# Patient Record
Sex: Male | Born: 1960 | Race: White | Hispanic: No | Marital: Single | Smoking: Current every day smoker
Health system: Southern US, Community
[De-identification: ages and names within clinical notes are randomized; demographics above are authoritative.]

## PROBLEM LIST (undated history)

## (undated) DIAGNOSIS — K219 Gastro-esophageal reflux disease without esophagitis: Secondary | ICD-10-CM

## (undated) DIAGNOSIS — Z87442 Personal history of urinary calculi: Secondary | ICD-10-CM

## (undated) HISTORY — PX: APPENDECTOMY: SHX54

## (undated) HISTORY — PX: HAND SURGERY: SHX662

---

## 2008-03-14 ENCOUNTER — Emergency Department (HOSPITAL_COMMUNITY): Admission: EM | Admit: 2008-03-14 | Discharge: 2008-03-14 | Payer: Self-pay | Admitting: Emergency Medicine

## 2009-08-30 ENCOUNTER — Emergency Department (HOSPITAL_COMMUNITY): Admission: EM | Admit: 2009-08-30 | Discharge: 2009-08-30 | Payer: Self-pay | Admitting: Emergency Medicine

## 2013-07-09 ENCOUNTER — Emergency Department (HOSPITAL_COMMUNITY): Payer: BC Managed Care – PPO

## 2013-07-09 ENCOUNTER — Encounter (HOSPITAL_COMMUNITY): Payer: Self-pay | Admitting: Emergency Medicine

## 2013-07-09 ENCOUNTER — Emergency Department (HOSPITAL_COMMUNITY)
Admission: EM | Admit: 2013-07-09 | Discharge: 2013-07-10 | Disposition: A | Discharge status: Home / Self Care | Payer: BC Managed Care – PPO | Attending: Emergency Medicine | Admitting: Emergency Medicine

## 2013-07-09 DIAGNOSIS — N200 Calculus of kidney: Secondary | ICD-10-CM | POA: Insufficient documentation

## 2013-07-09 DIAGNOSIS — F17200 Nicotine dependence, unspecified, uncomplicated: Secondary | ICD-10-CM | POA: Insufficient documentation

## 2013-07-09 LAB — BASIC METABOLIC PANEL
Calcium: 9.3 mg/dL (ref 8.4–10.5)
GFR calc non Af Amer: 66 mL/min — ABNORMAL LOW (ref >90)
Glucose, Bld: 157 mg/dL — ABNORMAL HIGH (ref 70–99)
Sodium: 137 mEq/L (ref 135–145)

## 2013-07-09 LAB — CBC WITH DIFFERENTIAL/PLATELET
Basophils Relative: 1 % (ref 0–1)
Eosinophils Absolute: 0.1 10*3/uL (ref 0.0–0.7)
Eosinophils Relative: 2 % (ref 0–5)
Lymphs Abs: 3.3 10*3/uL (ref 0.7–4.0)
Monocytes Absolute: 0.6 10*3/uL (ref 0.1–1.0)
Monocytes Relative: 8 % (ref 3–12)
RBC: 5.41 MIL/uL (ref 4.22–5.81)
WBC: 8.1 10*3/uL (ref 4.0–10.5)

## 2013-07-09 LAB — URINE MICROSCOPIC-ADD ON

## 2013-07-09 LAB — URINALYSIS, ROUTINE W REFLEX MICROSCOPIC
Glucose, UA: NEGATIVE mg/dL
Ketones, ur: 15 mg/dL — AB
Nitrite: NEGATIVE
Protein, ur: 30 mg/dL — AB
Urobilinogen, UA: 0.2 mg/dL (ref 0.0–1.0)

## 2013-07-09 MED ORDER — ONDANSETRON HCL 4 MG/2ML IJ SOLN
4.0000 mg | Freq: Once | INTRAMUSCULAR | Status: AC
  Administered 2013-07-09: 4 mg via INTRAVENOUS
  Filled 2013-07-09: qty 2

## 2013-07-09 MED ORDER — KETOROLAC TROMETHAMINE 30 MG/ML IJ SOLN
30.0000 mg | Freq: Once | INTRAMUSCULAR | Status: AC
  Administered 2013-07-09: 30 mg via INTRAVENOUS
  Filled 2013-07-09: qty 1

## 2013-07-09 MED ORDER — SODIUM CHLORIDE 0.9 % IV BOLUS (SEPSIS)
1000.0000 mL | Freq: Once | INTRAVENOUS | Status: AC
  Administered 2013-07-09: 1000 mL via INTRAVENOUS

## 2013-07-09 MED ORDER — HYDROMORPHONE HCL PF 1 MG/ML IJ SOLN
1.0000 mg | Freq: Once | INTRAMUSCULAR | Status: AC
  Administered 2013-07-09: 1 mg via INTRAVENOUS
  Filled 2013-07-09: qty 1

## 2013-07-09 MED ORDER — OXYCODONE-ACETAMINOPHEN 5-325 MG PO TABS
1.0000 | ORAL_TABLET | Freq: Once | ORAL | Status: AC
  Administered 2013-07-09: 1 via ORAL
  Filled 2013-07-09: qty 1

## 2013-07-09 NOTE — ED Notes (Signed)
Pt states he is having testicle pain that is radiating into his abd.  Pt states this pain is similar to when he has had kidney stones in the past.

## 2013-07-09 NOTE — ED Provider Notes (Signed)
CSN: [id number]     Arrival date & time 07/09/13  2206 History   None    Chief Complaint  Patient presents with  . Testicle Pain   (Consider location/radiation/quality/duration/timing/severity/associated sxs/prior Treatment) HPI Comments: 52 yo wm with L sided test pain and LLQ abd pain. Onset 2 hrs ago. Pt also has very dark urine.  He has a h/o kidney stones 15 years ago and his symptoms are identic today.    Patient is a 52 y.o. male presenting with testicular pain. The history is provided by the patient and a significant other.  Testicle Pain This is a new problem. The current episode started 1 to 2 hours ago. The problem occurs constantly. The problem has not changed since onset.Associated symptoms include abdominal pain. Pertinent negatives include no chest pain, no headaches and no shortness of breath. Nothing aggravates the symptoms. Nothing relieves the symptoms. He has tried nothing (feels identicle to previous kidney stone) for the symptoms.    Past Medical History  Diagnosis Date  . Kidney stones    Past Surgical History  Procedure Laterality Date  . Appendectomy    . Hand surgery     No family history on file. History  Substance Use Topics  . Smoking status: Current Every Day Smoker -- 0.50 packs/day    Types: Cigarettes  . Smokeless tobacco: Not on file  . Alcohol Use: 4.8 oz/week    8 Cans of beer per week    Review of Systems  HENT: Negative.   Respiratory: Negative.  Negative for shortness of breath.   Cardiovascular: Negative for chest pain.  Gastrointestinal: Positive for nausea and abdominal pain.  Genitourinary: Positive for flank pain and testicular pain. Negative for dysuria, frequency, hematuria, discharge, penile swelling, scrotal swelling, enuresis, difficulty urinating, genital sores and penile pain.  Musculoskeletal: Negative.   Neurological: Negative for headaches.    Allergies  Review of patient's allergies indicates no known  allergies.  Home Medications   Current Outpatient Rx  Name  Route  Sig  Dispense  Refill  . omeprazole (PRILOSEC) 20 MG capsule   Oral   Take 20 mg by mouth 2 (two) times daily as needed (for acid reflux).         Marland Kitchen HYDROcodone-acetaminophen (NORCO/VICODIN) 5-325 MG per tablet   Oral   Take 2 tablets by mouth every 6 (six) hours as needed.   20 tablet   0   . naproxen (NAPROSYN) 500 MG tablet   Oral   Take 1 tablet (500 mg total) by mouth 2 (two) times daily.   30 tablet   0   . ondansetron (ZOFRAN) 4 MG tablet   Oral   Take 1 tablet (4 mg total) by mouth every 6 (six) hours.   12 tablet   0    BP 131/63  Pulse 96  Temp(Src) 98 F (36.7 C) (Oral)  Resp 16  Wt 181 lb 12.8 oz (82.464 kg)  SpO2 96% Physical Exam  Nursing note and vitals reviewed. Constitutional: He appears well-developed and well-nourished. He appears distressed.  HENT:  Head: Normocephalic and atraumatic.  Eyes: Conjunctivae are normal. Pupils are equal, round, and reactive to light.  Neck: Normal range of motion. Neck supple.  Cardiovascular: Normal rate and regular rhythm.   Pulmonary/Chest: Effort normal and breath sounds normal.  Abdominal: Soft. Bowel sounds are normal. He exhibits no distension and no mass. There is no tenderness. There is no rebound and no guarding. Hernia confirmed negative in the  right inguinal area and confirmed negative in the left inguinal area.  Genitourinary: Testes normal and penis normal. Cremasteric reflex is present. Right testis shows no mass, no swelling and no tenderness. Left testis shows no mass, no swelling and no tenderness. Circumcised. No phimosis, paraphimosis, hypospadias, penile erythema or penile tenderness. No discharge found.  Benign exam    ED Course  Procedures (including critical care time) Labs Review Results for orders placed during the hospital encounter of 07/09/13  URINALYSIS, ROUTINE W REFLEX MICROSCOPIC      Result Value Range   Color,  Urine AMBER (*) YELLOW   APPearance CLOUDY (*) CLEAR   Specific Gravity, Urine 1.024  1.005 - 1.030   pH 5.0  5.0 - 8.0   Glucose, UA NEGATIVE  NEGATIVE mg/dL   Hgb urine dipstick LARGE (*) NEGATIVE   Bilirubin Urine NEGATIVE  NEGATIVE   Ketones, ur 15 (*) NEGATIVE mg/dL   Protein, ur 30 (*) NEGATIVE mg/dL   Urobilinogen, UA 0.2  0.0 - 1.0 mg/dL   Nitrite NEGATIVE  NEGATIVE   Leukocytes, UA TRACE (*) NEGATIVE  CBC WITH DIFFERENTIAL      Result Value Range   WBC 8.1  4.0 - 10.5 K/uL   RBC 5.41  4.22 - 5.81 MIL/uL   Hemoglobin 15.3  13.0 - 17.0 g/dL   HCT 29.5  62.1 - 30.8 %   MCV 82.4  78.0 - 100.0 fL   MCH 28.3  26.0 - 34.0 pg   MCHC 34.3  30.0 - 36.0 g/dL   RDW 65.7  84.6 - 96.2 %   Platelets 274  150 - 400 K/uL   Neutrophils Relative % 50  43 - 77 %   Neutro Abs 4.1  1.7 - 7.7 K/uL   Lymphocytes Relative 40  12 - 46 %   Lymphs Abs 3.3  0.7 - 4.0 K/uL   Monocytes Relative 8  3 - 12 %   Monocytes Absolute 0.6  0.1 - 1.0 K/uL   Eosinophils Relative 2  0 - 5 %   Eosinophils Absolute 0.1  0.0 - 0.7 K/uL   Basophils Relative 1  0 - 1 %   Basophils Absolute 0.1  0.0 - 0.1 K/uL  BASIC METABOLIC PANEL      Result Value Range   Sodium 137  135 - 145 mEq/L   Potassium 3.6  3.5 - 5.1 mEq/L   Chloride 99  96 - 112 mEq/L   CO2 23  19 - 32 mEq/L   Glucose, Bld 157 (*) 70 - 99 mg/dL   BUN 15  6 - 23 mg/dL   Creatinine, Ser 9.52  0.50 - 1.35 mg/dL   Calcium 9.3  8.4 - 84.1 mg/dL   GFR calc non Af Amer 66 (*) >90 mL/min   GFR calc Af Amer 76 (*) >90 mL/min  URINE MICROSCOPIC-ADD ON      Result Value Range   Squamous Epithelial / LPF FEW (*) RARE   WBC, UA 0-2  <3 WBC/hpf   RBC / HPF TOO NUMEROUS TO COUNT  <3 RBC/hpf   Bacteria, UA FEW (*) RARE   Casts HYALINE CASTS (*) NEGATIVE   Urine-Other MUCOUS PRESENT     CT ABD/Pelvis:  IMPRESSION: Left hydronephrosis secondary to a 4 mm proximal left ureteral calculus.  Probable small right lobe hepatic cyst 9 mm  diameter.   Electronically Signed By: Ulyses Southward M.D. On: 07/10/2013 00:21  MDM   1. Kidney stone    52 year old male presents emergency department with chief complaint of left lower quadrant, left flank, left testicle pain. ER workup consistent with kidney stone. Doubt infected stone. Stone was 4 mm. Patient received significant improvement of symptoms with ER medications. Plan to discharge patient home with Norco, Naprosyn, Zofran. ER precautions were given for fever, worsening pain, by mouth intolerance, or other concerns. Patient given consult to urology and he should call tomorrow for an appointment. Patient agrees with plan.  Darlys Gales, MD 07/10/13 [phone]

## 2013-07-10 MED ORDER — HYDROCODONE-ACETAMINOPHEN 5-325 MG PO TABS: 2.0000 | ORAL_TABLET | Freq: Four times a day (QID) | ORAL | Status: AC | PRN

## 2013-07-10 MED ORDER — NAPROXEN 500 MG PO TABS: 500.0000 mg | ORAL_TABLET | Freq: Two times a day (BID) | ORAL | Status: DC

## 2013-07-10 MED ORDER — ONDANSETRON HCL 4 MG PO TABS: 4.0000 mg | ORAL_TABLET | Freq: Four times a day (QID) | ORAL | Status: DC

## 2013-07-18 ENCOUNTER — Encounter (HOSPITAL_COMMUNITY): Payer: Self-pay | Admitting: *Deleted

## 2013-07-18 ENCOUNTER — Other Ambulatory Visit: Payer: Self-pay | Admitting: Urology

## 2013-07-18 NOTE — Progress Notes (Signed)
[facility]EDAY SURGERY INSTRUCTIONS AND CHLORHEXIDNE INSTRUCTIONS AND PRECAUTIONS REVIEWED WITH PATIENT.

## 2013-07-24 ENCOUNTER — Other Ambulatory Visit (HOSPITAL_COMMUNITY): Payer: Self-pay | Admitting: Urology

## 2013-07-24 DIAGNOSIS — K769 Liver disease, unspecified: Secondary | ICD-10-CM

## 2013-07-28 ENCOUNTER — Ambulatory Visit (HOSPITAL_COMMUNITY)
Admission: RE | Admit: 2013-07-28 | Discharge: 2013-07-28 | Disposition: A | Discharge status: Home / Self Care | Payer: BC Managed Care – PPO | Source: Ambulatory Visit | Attending: Urology | Admitting: Urology

## 2013-07-28 DIAGNOSIS — K769 Liver disease, unspecified: Secondary | ICD-10-CM

## 2013-07-28 DIAGNOSIS — N1330 Unspecified hydronephrosis: Secondary | ICD-10-CM | POA: Insufficient documentation

## 2013-07-28 DIAGNOSIS — R109 Unspecified abdominal pain: Secondary | ICD-10-CM | POA: Insufficient documentation

## 2013-07-28 DIAGNOSIS — K7689 Other specified diseases of liver: Secondary | ICD-10-CM | POA: Insufficient documentation

## 2013-07-30 ENCOUNTER — Encounter (HOSPITAL_COMMUNITY): Payer: Self-pay | Admitting: Emergency Medicine

## 2013-07-30 ENCOUNTER — Emergency Department (HOSPITAL_COMMUNITY)
Admission: EM | Admit: 2013-07-30 | Discharge: 2013-07-30 | Disposition: A | Discharge status: Home / Self Care | Payer: BC Managed Care – PPO | Attending: Emergency Medicine | Admitting: Emergency Medicine

## 2013-07-30 DIAGNOSIS — R109 Unspecified abdominal pain: Secondary | ICD-10-CM | POA: Insufficient documentation

## 2013-07-30 DIAGNOSIS — F17200 Nicotine dependence, unspecified, uncomplicated: Secondary | ICD-10-CM | POA: Insufficient documentation

## 2013-07-30 DIAGNOSIS — Z87442 Personal history of urinary calculi: Secondary | ICD-10-CM | POA: Insufficient documentation

## 2013-07-30 DIAGNOSIS — K219 Gastro-esophageal reflux disease without esophagitis: Secondary | ICD-10-CM | POA: Insufficient documentation

## 2013-07-30 LAB — URINALYSIS, ROUTINE W REFLEX MICROSCOPIC
Bilirubin Urine: NEGATIVE
Glucose, UA: NEGATIVE mg/dL
Ketones, ur: NEGATIVE mg/dL
Leukocytes, UA: NEGATIVE
Nitrite: NEGATIVE
Protein, ur: NEGATIVE mg/dL
Specific Gravity, Urine: 1.014 (ref 1.005–1.030)
Urobilinogen, UA: 0.2 mg/dL (ref 0.0–1.0)
pH: 6.5 (ref 5.0–8.0)

## 2013-07-30 LAB — URINE MICROSCOPIC-ADD ON

## 2013-07-30 MED ORDER — ONDANSETRON 4 MG PO TBDP
4.0000 mg | ORAL_TABLET | Freq: Once | ORAL | Status: AC
Start: 2013-07-30 — End: 2013-07-30
  Administered 2013-07-30: 4 mg via ORAL
  Filled 2013-07-30: qty 1

## 2013-07-30 MED ORDER — KETOROLAC TROMETHAMINE 30 MG/ML IJ SOLN
30.0000 mg | Freq: Once | INTRAMUSCULAR | Status: AC
  Administered 2013-07-30: 30 mg via INTRAMUSCULAR
  Filled 2013-07-30: qty 1

## 2013-07-30 MED ORDER — HYDROMORPHONE HCL PF 1 MG/ML IJ SOLN
1.0000 mg | Freq: Once | INTRAMUSCULAR | Status: AC
  Administered 2013-07-30: 1 mg via INTRAMUSCULAR
  Filled 2013-07-30: qty 1

## 2013-07-30 NOTE — ED Notes (Addendum)
Patient c/o lower abdominal pain that radiates into both groin areas. Patient denies any difficulties urinating or having blood in his urine. Patient states he was seen in the ED 2 days ago for the same and is scheduled for surgery this week. Patient also states he took a hydrocodone prior to coming to the ED for the pain.

## 2013-07-30 NOTE — H&P (Signed)
Chief Complaint Kidney stone   History of Present Illness Ian Todd is a 53 year old gentleman presents today after initially developing severe left-sided testicular pain approximately 1 week ago. He also was noted to have gross hematuria and some nausea. He presented to the emergency department and eventually was found to have hematuria and underwent a CT scan of the abdomen and pelvis without contrast which demonstrated a 4 mm proximal left ureteral calculus with hydronephrosis. His pain subsequently improved although he has since developed intermittent left-sided flank pain which has required narcotic pain medication for relief. He has denied any fever, persistent nausea/vomiting, or uncontrolled pain. He did have one episode of kidney stones approximately 15 years ago. He was able to spontaneously pass the stone without the need for surgical intervention.    Incidentally, he was also noted to have a 9 mm probable cyst of the right lobe of the liver on CT scan.   Past Medical History Problems  1. History of gastroesophageal reflux (GERD) (V12.79) 2. History of urinary stone (V13.01)  Surgical History Problems  1. History of Appendectomy 2. History of Hand Repair  Current Meds 1. Hydrocodone-Acetaminophen 5-325 MG Oral Tablet;  Therapy: (Recorded:22Dec2014) to Recorded 2. Naproxen 500 MG Oral Tablet;  Therapy: (Recorded:22Dec2014) to Recorded 3. Omeprazole 20 MG Oral Tablet Delayed Release;  Therapy: (Recorded:22Dec2014) to Recorded 4. Zofran 4 MG Oral Tablet;  Therapy: (Recorded:22Dec2014) to Recorded  Allergies Medication  1. No Known Drug Allergies  Family History Problems  1. No pertinent family history : Father  Social History Problems    Alcohol use   8 cans of beer per week   Current every day smoker (305.1)   Number of children   1 girl   Single  Review of Systems Constitutional, skin, eye, otolaryngeal, hematologic/lymphatic, cardiovascular, pulmonary,  endocrine, musculoskeletal, gastrointestinal, neurological and psychiatric system(s) were reviewed and pertinent findings if present are noted.  Gastrointestinal: heartburn.  Musculoskeletal: back pain.    Vitals Vital Signs [Data Includes: Last 1 Day]  Recorded: 23Dec2014 08:09AM  Height: 5 ft 9 in Weight: 175 lb  BMI Calculated: 25.84 BSA Calculated: 1.95 Blood Pressure: 107 / 74 Heart Rate: 69  Physical Exam Constitutional: Well nourished and well developed . No acute distress.  ENT:. The ears and nose are normal in appearance.  Neck: The appearance of the neck is normal and no neck mass is present.  Pulmonary: No respiratory distress, normal respiratory rhythm and effort and clear bilateral breath sounds.  Cardiovascular: Heart rate and rhythm are normal . No peripheral edema.  Abdomen: The abdomen is soft and nontender. No masses are palpated. No CVA tenderness. No hernias are palpable. No hepatosplenomegaly noted.  Genitourinary: Examination of the penis demonstrates no discharge, no masses, no lesions and a normal meatus. The scrotum is without lesions. The right epididymis is palpably normal and non-tender. The left epididymis is palpably normal and non-tender. The right testis is non-tender and without masses. The left testis is non-tender and without masses.  Lymphatics: The femoral and inguinal nodes are not enlarged or tender.  Skin: Normal skin turgor, no visible rash and no visible skin lesions.  Neuro/Psych:. Mood and affect are appropriate.    Results/Data Urine [Data Includes: Last 1 Day]   23Dec2014  COLOR YELLOW   APPEARANCE CLEAR   SPECIFIC GRAVITY 1.020   pH 5.5   GLUCOSE NEG mg/dL  BILIRUBIN NEG   KETONE NEG mg/dL  BLOOD SMALL   PROTEIN NEG mg/dL  UROBILINOGEN 0.2 mg/dL  NITRITE  NEG   LEUKOCYTE ESTERASE NEG   SQUAMOUS EPITHELIAL/HPF RARE   WBC 0-2 WBC/hpf  RBC 3-6 RBC/hpf  BACTERIA RARE   CRYSTALS NONE SEEN   CASTS NONE SEEN    I independently  reviewed his CT scan and medical records. His CT scan from 07/10/13 demonstrates a 4 mm left proximal ureteral calculus with left-sided hydronephrosis. This stone measures 663 Hounsfield units. No other calculi are identified. He also has a small hypodense lesion of the right lobe of the liver consistent with a possible cyst.     I independently reviewed a KUB x-ray today. There is a small 3 mm calcification in the vicinity of the distal left ureter and that possibly represents his ureteral calculus. I do not see any calcifications over the expected course of the proximal left ureter. He does have bilateral phleboliths within the pelvis.    Serum creatinine is 1.25.   Assessment Assessed  1. Ureteral stone with hydronephrosis (592.1,591) 2. 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 UA With REFLEX; [Do Not Release]; Status:Complete;   Done: 23Dec2014 08:01AM Neoplasm of uncertain behavior of liver  2. COMPREHENSIVE METABOLIC PANEL; Status:Hold For - Specimen/Data  Collection,Appointment; Requested for:23Dec2014;  3. OUTSIDE RAD MISC; Status:Hold For - Appointment,PreCert,Print,Records; Requested  for:23Dec2014;  Ureteral stone with hydronephrosis  4. Start: Oxycodone-Acetaminophen 5-325 MG Oral Tablet (Percocet); Take 1-2 tablets every  4-6 hrs 5. Follow-up Office  Follow-up - Will call to tentatively schedule surgery  Status: Hold For -  Appointment,Date of Service  Requested for: 23Dec2014 6. [facility]; Status:Resulted - Requires Verification;   Done: 23Dec2014 12:00AM  Discussion/Summary 1. Left ureteral calculus: We discussed options for management. Currently, he has no indication for urgent surgical intervention. He therefore will proceed with medical expulsion therapy and has been provided samples for Rapaflo today with instructions on proper use and potential side effects as well as counseling that this is off label use for this medication. He will  strain his urine and understands that he should call us should he develop fever, persistent nausea/vomiting, or uncontrolled pain. He has been provided a prescription for Percocet # 50 today.   Tentatively, we have discussed proceeding with definitive surgical intervention if he is unable to pass his stone. We reviewed treatment options including shockwave lithotripsy and ureteroscopic laser lithotripsy and the pros and cons of each approach. After our discussion, he does tentatively wish to proceed with left ureteroscopic lithotripsy and stone removal. We did review the potential risks, complications, and expected recovery process as well as the potential need for a postoperative ureteral stent. He gives informed consent today. He will notify me if he passes a stone before his planned procedure.    2. Hypodense hepatic lesion: While this is statistically a cyst, he has not undergone definitive evaluation and does have concern about this lesion. Liver function tests will be checked today and he will be scheduled for an abdominal ultrasound for further evaluation.         Signatures Electronically signed by : Heloise Purpura, M.D.; Jul 18 2013  9:16AM EST

## 2013-07-30 NOTE — ED Notes (Signed)
Patient's wife came to [facility] and rudely stated, "well, what are we waiting on now." Writer went to patient and patient stated he was ready for discharge. EDP notified.

## 2013-07-31 ENCOUNTER — Encounter (HOSPITAL_COMMUNITY): Payer: Self-pay | Admitting: *Deleted

## 2013-07-31 ENCOUNTER — Ambulatory Visit (HOSPITAL_COMMUNITY)
Admission: RE | Admit: 2013-07-31 | Discharge: 2013-07-31 | Disposition: A | Discharge status: Home / Self Care | Payer: BC Managed Care – PPO | Source: Ambulatory Visit | Attending: Urology | Admitting: Urology

## 2013-07-31 ENCOUNTER — Encounter (HOSPITAL_COMMUNITY): Payer: BC Managed Care – PPO | Admitting: Anesthesiology

## 2013-07-31 ENCOUNTER — Ambulatory Visit (HOSPITAL_COMMUNITY): Payer: BC Managed Care – PPO | Admitting: Anesthesiology

## 2013-07-31 ENCOUNTER — Encounter (HOSPITAL_COMMUNITY)
Admission: RE | Disposition: A | Discharge status: Home / Self Care | Payer: Self-pay | Source: Ambulatory Visit | Attending: Urology

## 2013-07-31 DIAGNOSIS — Z87442 Personal history of urinary calculi: Secondary | ICD-10-CM | POA: Insufficient documentation

## 2013-07-31 DIAGNOSIS — K219 Gastro-esophageal reflux disease without esophagitis: Secondary | ICD-10-CM | POA: Insufficient documentation

## 2013-07-31 DIAGNOSIS — I4510 Unspecified right bundle-branch block: Secondary | ICD-10-CM | POA: Insufficient documentation

## 2013-07-31 DIAGNOSIS — N1330 Unspecified hydronephrosis: Secondary | ICD-10-CM | POA: Insufficient documentation

## 2013-07-31 DIAGNOSIS — N509 Disorder of male genital organs, unspecified: Secondary | ICD-10-CM | POA: Insufficient documentation

## 2013-07-31 DIAGNOSIS — Z79899 Other long term (current) drug therapy: Secondary | ICD-10-CM | POA: Insufficient documentation

## 2013-07-31 DIAGNOSIS — R12 Heartburn: Secondary | ICD-10-CM | POA: Insufficient documentation

## 2013-07-31 DIAGNOSIS — K7689 Other specified diseases of liver: Secondary | ICD-10-CM | POA: Insufficient documentation

## 2013-07-31 DIAGNOSIS — F17200 Nicotine dependence, unspecified, uncomplicated: Secondary | ICD-10-CM | POA: Insufficient documentation

## 2013-07-31 DIAGNOSIS — N201 Calculus of ureter: Secondary | ICD-10-CM | POA: Insufficient documentation

## 2013-07-31 HISTORY — PX: HOLMIUM LASER APPLICATION: SHX5852

## 2013-07-31 HISTORY — PX: CYSTOSCOPY WITH RETROGRADE PYELOGRAM, URETEROSCOPY AND STENT PLACEMENT: SHX5789

## 2013-07-31 HISTORY — DX: Personal history of urinary calculi: Z87.442

## 2013-07-31 HISTORY — DX: Gastro-esophageal reflux disease without esophagitis: K21.9

## 2013-07-31 SURGERY — CYSTOURETEROSCOPY, WITH RETROGRADE PYELOGRAM AND STENT INSERTION
Anesthesia: General | Site: Ureter | Laterality: Left

## 2013-07-31 MED ORDER — CIPROFLOXACIN IN D5W 400 MG/200ML IV SOLN
400.0000 mg | INTRAVENOUS | Status: DC
Start: 2013-07-31 — End: 2013-07-31

## 2013-07-31 MED ORDER — PROMETHAZINE HCL 25 MG/ML IJ SOLN: 6.2500 mg | INTRAMUSCULAR | Status: DC | PRN

## 2013-07-31 MED ORDER — KETAMINE HCL 10 MG/ML IJ SOLN
INTRAMUSCULAR | Status: AC
  Filled 2013-07-31: qty 1

## 2013-07-31 MED ORDER — CIPROFLOXACIN IN D5W 400 MG/200ML IV SOLN
INTRAVENOUS | Status: AC
  Filled 2013-07-31: qty 200

## 2013-07-31 MED ORDER — MIDAZOLAM HCL 2 MG/2ML IJ SOLN
INTRAMUSCULAR | Status: AC
  Filled 2013-07-31: qty 2

## 2013-07-31 MED ORDER — SODIUM CHLORIDE 0.9 % IR SOLN
Status: DC | PRN
  Administered 2013-07-31: 6000 mL

## 2013-07-31 MED ORDER — MIDAZOLAM HCL 5 MG/5ML IJ SOLN
INTRAMUSCULAR | Status: DC | PRN
  Administered 2013-07-31: 1 mg via INTRAVENOUS

## 2013-07-31 MED ORDER — CISATRACURIUM BESYLATE (PF) 10 MG/5ML IV SOLN
INTRAVENOUS | Status: DC | PRN
  Administered 2013-07-31: 400 mg via INTRAVENOUS

## 2013-07-31 MED ORDER — HYDROCODONE-ACETAMINOPHEN 5-325 MG PO TABS: 1.0000 | ORAL_TABLET | Freq: Four times a day (QID) | ORAL | Status: AC | PRN

## 2013-07-31 MED ORDER — FENTANYL CITRATE 0.05 MG/ML IJ SOLN
INTRAMUSCULAR | Status: AC
  Filled 2013-07-31: qty 2

## 2013-07-31 MED ORDER — CIPROFLOXACIN HCL 500 MG PO TABS: ORAL_TABLET | ORAL | Status: AC

## 2013-07-31 MED ORDER — ONDANSETRON HCL 4 MG/2ML IJ SOLN
INTRAMUSCULAR | Status: AC
  Filled 2013-07-31: qty 2

## 2013-07-31 MED ORDER — HYDROMORPHONE HCL PF 1 MG/ML IJ SOLN
0.2500 mg | INTRAMUSCULAR | Status: DC | PRN
  Administered 2013-07-31 (×2): 0.5 mg via INTRAVENOUS

## 2013-07-31 MED ORDER — DEXAMETHASONE SODIUM PHOSPHATE 10 MG/ML IJ SOLN
INTRAMUSCULAR | Status: AC
  Filled 2013-07-31: qty 1

## 2013-07-31 MED ORDER — KETAMINE HCL 10 MG/ML IJ SOLN
INTRAMUSCULAR | Status: DC | PRN
  Administered 2013-07-31: 25 mg via INTRAVENOUS

## 2013-07-31 MED ORDER — FENTANYL CITRATE 0.05 MG/ML IJ SOLN
INTRAMUSCULAR | Status: DC | PRN
  Administered 2013-07-31 (×2): 50 ug via INTRAVENOUS

## 2013-07-31 MED ORDER — PROPOFOL 10 MG/ML IV BOLUS
INTRAVENOUS | Status: AC
  Filled 2013-07-31: qty 20

## 2013-07-31 MED ORDER — OXYCODONE HCL 5 MG PO TABS
5.0000 mg | ORAL_TABLET | Freq: Once | ORAL | Status: AC | PRN
  Administered 2013-07-31: 5 mg via ORAL
  Filled 2013-07-31: qty 1

## 2013-07-31 MED ORDER — HYDROMORPHONE HCL PF 1 MG/ML IJ SOLN
INTRAMUSCULAR | Status: AC
  Filled 2013-07-31: qty 1

## 2013-07-31 MED ORDER — OXYCODONE HCL 5 MG/5ML PO SOLN
5.0000 mg | Freq: Once | ORAL | Status: AC | PRN
  Filled 2013-07-31: qty 5

## 2013-07-31 MED ORDER — LACTATED RINGERS IV SOLN
INTRAVENOUS | Status: DC | PRN
  Administered 2013-07-31: 10:00:00 via INTRAVENOUS

## 2013-07-31 MED ORDER — ONDANSETRON HCL 4 MG/2ML IJ SOLN
INTRAMUSCULAR | Status: DC | PRN
  Administered 2013-07-31 (×2): 2 mg via INTRAVENOUS

## 2013-07-31 MED ORDER — PROPOFOL 10 MG/ML IV BOLUS
INTRAVENOUS | Status: DC | PRN
  Administered 2013-07-31: 200 mg via INTRAVENOUS

## 2013-07-31 MED ORDER — GLYCOPYRROLATE 0.2 MG/ML IJ SOLN
INTRAMUSCULAR | Status: AC
  Filled 2013-07-31: qty 1

## 2013-07-31 MED ORDER — MEPERIDINE HCL 50 MG/ML IJ SOLN: 6.2500 mg | INTRAMUSCULAR | Status: DC | PRN

## 2013-07-31 MED ORDER — IOHEXOL 300 MG/ML  SOLN
INTRAMUSCULAR | Status: DC | PRN
  Administered 2013-07-31: 6 mL via URETHRAL

## 2013-07-31 MED ORDER — LIDOCAINE HCL (CARDIAC) 20 MG/ML IV SOLN
INTRAVENOUS | Status: DC | PRN
  Administered 2013-07-31: 30 mg via INTRAVENOUS

## 2013-07-31 SURGICAL SUPPLY — 17 items
BAG URO CATCHER STRL LF (DRAPE) ×3 IMPLANT
BASKET ZERO TIP NITINOL 2.4FR (BASKET) ×3 IMPLANT
CATH INTERMIT  6FR 70CM (CATHETERS) ×3 IMPLANT
DRAPE CAMERA CLOSED 9X96 (DRAPES) ×3 IMPLANT
FIBER LASER FLEXIVA 365 (UROLOGICAL SUPPLIES) ×3 IMPLANT
GLOVE BIOGEL M STRL SZ7.5 (GLOVE) ×3 IMPLANT
GLOVE BIOGEL PI IND STRL 6.5 (GLOVE) ×1 IMPLANT
GLOVE BIOGEL PI INDICATOR 6.5 (GLOVE) ×2
GOWN PREVENTION PLUS LG XLONG (DISPOSABLE) ×6 IMPLANT
GOWN STRL REIN XL XLG (GOWN DISPOSABLE) ×3 IMPLANT
GUIDEWIRE ANG ZIPWIRE 038X150 (WIRE) IMPLANT
GUIDEWIRE STR DUAL SENSOR (WIRE) ×3 IMPLANT
MANIFOLD NEPTUNE II (INSTRUMENTS) ×3 IMPLANT
PACK CYSTO (CUSTOM PROCEDURE TRAY) ×3 IMPLANT
STENT CONTOUR 6FRX24X.038 (STENTS) ×3 IMPLANT
TUBING CONNECTING 10 (TUBING) ×2 IMPLANT
TUBING CONNECTING 10' (TUBING) ×1

## 2013-07-31 NOTE — OR Nursing (Signed)
Stone went with Dr. Laverle PatterBorden

## 2013-07-31 NOTE — Discharge Instructions (Signed)
1. You may see some blood in the urine and may have some burning with urination for 48-72 hours. You also may notice that you have to urinate more frequently or urgently after your procedure which is normal.  2. You should call should you develop an inability urinate, fever > 101, persistent nausea and vomiting that prevents you from eating or drinking to stay hydrated.  3. If you have a stent, you will likely urinate more frequently and urgently until the stent is removed and you may experience some discomfort/pain in the lower abdomen and flank especially when urinating. You may take pain medication prescribed to you if needed for pain. You may also intermittently have blood in the urine until the stent is removed. 4. You may remove your stent on Friday morning.  You should simply pull the string out of the penis and the stent will easily come out with it.  It may be best to do this in the shower as some urine will likely come out with the stent.  You may experience some transient pain or discomfort after the stent is removed.  You can take pain medication as needed and prescribed.

## 2013-07-31 NOTE — Anesthesia Preprocedure Evaluation (Signed)
Anesthesia Evaluation  Patient identified by MRN, date of birth, ID band Patient awake    Reviewed: Allergy & Precautions, H&P , NPO status , Patient's Chart, lab work & pertinent test results  Airway Mallampati: II TM Distance: >3 FB Neck ROM: Full    Dental  (+) Dental Advisory Given and Teeth Intact   Pulmonary Current Smoker,  breath sounds clear to auscultation        Cardiovascular negative cardio ROS  Rhythm:Regular Rate:Normal     Neuro/Psych negative neurological ROS  negative psych ROS   GI/Hepatic Neg liver ROS, GERD-  Medicated,  Endo/Other  negative endocrine ROS  Renal/GU negative Renal ROS     Musculoskeletal negative musculoskeletal ROS (+)   Abdominal   Peds  Hematology negative hematology ROS (+)   Anesthesia Other Findings   Reproductive/Obstetrics negative OB ROS                           Anesthesia Physical Anesthesia Plan  ASA: II  Anesthesia Plan: General   Post-op Pain Management:    Induction: Intravenous  Airway Management Planned: LMA  Additional Equipment:   Intra-op Plan:   Post-operative Plan: Extubation in OR  Informed Consent: I have reviewed the patients History and Physical, chart, labs and discussed the procedure including the risks, benefits and alternatives for the proposed anesthesia with the patient or authorized representative who has indicated his/her understanding and acceptance.   Dental advisory given  Plan Discussed with: CRNA  Anesthesia Plan Comments:         Anesthesia Quick Evaluation

## 2013-07-31 NOTE — Transfer of Care (Signed)
Immediate Anesthesia Transfer of Care Note  Patient: Ian Todd  Procedure(s) Performed: Procedure(s): CYSTOSCOPY WITH RETROGRADE PYELOGRAM, URETEROSCOPY AND STENT PLACEMENT (Left) HOLMIUM LASER APPLICATION (Left)  Patient Location: PACU  Anesthesia Type:General  Level of Consciousness: awake, alert , oriented, sedated and patient cooperative  Airway & Oxygen Therapy: Patient Spontanous Breathing and Patient connected to face mask oxygen  Post-op Assessment: Report given to PACU RN and Post -op Vital signs reviewed and stable  Post vital signs: stable  Complications: No apparent anesthesia complications

## 2013-07-31 NOTE — Anesthesia Postprocedure Evaluation (Signed)
Anesthesia Post Note  Patient: Ian Todd  Procedure(s) Performed: Procedure(s) (LRB): CYSTOSCOPY WITH RETROGRADE PYELOGRAM, URETEROSCOPY AND STENT PLACEMENT (Left) HOLMIUM LASER APPLICATION (Left)  Anesthesia type: General  Patient location: PACU  Post pain: Pain level controlled  Post assessment: Post-op Vital signs reviewed  Last Vitals: BP 138/95  Pulse 76  Temp(Src) 36.5 C (Oral)  Resp 16  Ht 5\' 9"  (1.753 m)  Wt 182 lb 6 oz (82.725 kg)  BMI 26.92 kg/m2  SpO2 100%  Post vital signs: Reviewed  Level of consciousness: sedated  Complications: No apparent anesthesia complications

## 2013-07-31 NOTE — Interval H&P Note (Signed)
History and Physical Interval Note:  07/31/2013 10:03 AM  Ian Todd  has presented today for surgery, with the diagnosis of LEFT URETERAL CALCULUS  The various methods of treatment have been discussed with the patient and family. After consideration of risks, benefits and other options for treatment, the patient has consented to  Procedure(s): CYSTOSCOPY WITH RETROGRADE PYELOGRAM, URETEROSCOPY AND STENT PLACEMENT (Left) HOLMIUM LASER APPLICATION (Left) as a surgical intervention .  The patient's history has been reviewed, patient examined, no change in status, stable for surgery.  I have reviewed the patient's chart and labs.  Questions were answered to the patient's satisfaction.     Tino Ronan,LES

## 2013-07-31 NOTE — Op Note (Signed)
Preoperative diagnosis: Left ureteral calculus  Postoperative diagnosis: Left ureteral calculus  Procedure:  1. Cystoscopy 2. Left ureteroscopy and stone removal 3. Ureteroscopic laser lithotripsy 4. Left ureteral stent placement (6 x 24)  5. Left retrograde pyelography with interpretation  Surgeon: Moody BruinsLester S. Avin Upperman, Jr. M.D.  Anesthesia: General  Complications: None  Intraoperative findings: Left retrograde pyelography demonstrated a filling defect within the mid left ureter consistent with the patient's known calculus without other abnormalities.  EBL: Minimal  Specimens: 1. Left ureteral calculus  Disposition of specimens: [facility] for stone analysis  Indication: [name] is a 53 y.o. year old patient with urolithiasis. After reviewing the management options for treatment, the patient elected to proceed with the above surgical procedure(s). We have discussed the potential benefits and risks of the procedure, side effects of the proposed treatment, the likelihood of the patient achieving the goals of the procedure, and any potential problems that might occur during the procedure or recuperation. Informed consent has been obtained.  Description of procedure:  The patient was taken to the operating room and general anesthesia was induced.  The patient was placed in the dorsal lithotomy position, prepped and draped in the usual sterile fashion, and preoperative antibiotics were administered. A preoperative time-out was performed.   Cystourethroscopy was performed.  The patient's urethra was examined and was normal. The bladder was then systematically examined in its entirety. There was no evidence for any bladder tumors, stones, or other mucosal pathology.    Attention then turned to the left ureteral orifice and a ureteral catheter was used to intubate the ureteral orifice.  Omnipaque contrast was injected through the ureteral catheter and a retrograde  pyelogram was performed with findings as dictated above.  A 0.38 sensor guidewire was then advanced up the left ureter into the renal pelvis under fluoroscopic guidance. The 6 Fr semirigid ureteroscope was then advanced into the ureter next to the guidewire and the calculus was identified.   The stone was then fragmented with the 365 micron holmium laser fiber on a setting of 0.6 J and frequency of 6 Hz.   All stones were then removed from the ureter with a zero tip nitinol basket.  Reinspection of the ureter revealed no remaining visible stones or fragments.   The wire was then backloaded through the cystoscope and a ureteral stent was advance over the wire using Seldinger technique.  The stent was positioned appropriately under fluoroscopic and cystoscopic guidance.  The wire was then removed with an adequate stent curl noted in the renal pelvis as well as in the bladder.  The bladder was then emptied and the procedure ended.  The patient appeared to tolerate the procedure well and without complications.  The patient was able to be awakened and transferred to the recovery unit in satisfactory condition.

## 2013-08-01 ENCOUNTER — Encounter (HOSPITAL_COMMUNITY): Payer: Self-pay | Admitting: Urology

## 2013-08-04 NOTE — ED Provider Notes (Signed)
CSN: [id number]     Arrival date & time 07/30/13  1214 History   First MD Initiated Contact with Patient 07/30/13 1319     Chief Complaint  Patient presents with  . Groin Pain   (Consider location/radiation/quality/duration/timing/severity/associated sxs/prior Treatment) HPI   53 year old M with b/l groin pain. About a week ago developed severe left-sided testicular pain. He also was noted to have gross hematuria and some nausea. He presented to the emergency department and eventually was found to have hematuria and underwent a CT scan of the abdomen and pelvis without contrast which demonstrated a 4 mm proximal left ureteral calculus with hydronephrosis. His pain subsequently improved although he has since developed intermittent left-sided flank pain which has required narcotic pain medication for relief. He has denied any fever, persistent nausea/vomiting. Uncontrolled pain with home meds which is coming back to ED. Has urology follow-up. He did have one episode of kidney stones approximately 15 years ago. He was able to spontaneously pass the stone without the need for surgical intervention   Past Medical History  Diagnosis Date  . GERD (gastroesophageal reflux disease)   . History of kidney stones    Past Surgical History  Procedure Laterality Date  . Appendectomy    . Hand surgery    . Cystoscopy with retrograde pyelogram, ureteroscopy and stent placement Left 07/31/2013    Procedure: CYSTOSCOPY WITH RETROGRADE PYELOGRAM, URETEROSCOPY AND STENT PLACEMENT;  Surgeon: Crecencio Mc, MD;  Location: [facility];  Service: Urology;  Laterality: Left;  . Holmium laser application Left 07/31/2013    Procedure: HOLMIUM LASER APPLICATION;  Surgeon: Crecencio Mc, MD;  Location: [facility];  Service: Urology;  Laterality: Left;   History reviewed. No pertinent family history. History  Substance Use Topics  . Smoking status: Current Every Day Smoker -- 0.50 packs/day for 25 years    Types: Cigarettes  .  Smokeless tobacco: Never Used  . Alcohol Use: 4.8 oz/week    8 Cans of beer per week    Review of Systems  All systems reviewed and negative, other than as noted in HPI.   Allergies  Review of patient's allergies indicates no known allergies.  Home Medications   Current Outpatient Rx  Name  Route  Sig  Dispense  Refill  . HYDROcodone-acetaminophen (NORCO/VICODIN) 5-325 MG per tablet   Oral   Take 2 tablets by mouth every 6 (six) hours as needed.   20 tablet   0   . omeprazole (PRILOSEC) 20 MG capsule   Oral   Take 20 mg by mouth 2 (two) times daily as needed (for acid reflux).         . ciprofloxacin (CIPRO) 500 MG tablet      Begin one day prior to stent removal.   6 tablet   0   . HYDROcodone-acetaminophen (NORCO/VICODIN) 5-325 MG per tablet   Oral   Take 1-2 tablets by mouth every 6 (six) hours as needed.   35 tablet   0    BP 117/64  Pulse 63  Temp(Src) 98.6 F (37 C) (Oral)  Resp 18  SpO2 97% Physical Exam  Nursing note and vitals reviewed. Constitutional: He appears well-developed and well-nourished. No distress.  HENT:  Head: Normocephalic and atraumatic.  Eyes: Conjunctivae are normal. Right eye exhibits no discharge. Left eye exhibits no discharge.  Neck: Neck supple.  Cardiovascular: Normal rate, regular rhythm and normal heart sounds.  Exam reveals no gallop and no friction rub.   No murmur heard. Pulmonary/Chest:  Effort normal and breath sounds normal. No respiratory distress.  Abdominal: Soft. He exhibits no distension. There is no tenderness.  Genitourinary:  No cva tenderness  Musculoskeletal: He exhibits no edema and no tenderness.  Neurological: He is alert.  Skin: Skin is warm and dry.  Psychiatric: He has a normal mood and affect. His behavior is normal. Thought content normal.    ED Course  Procedures (including critical care time) Labs Review Labs Reviewed  URINALYSIS, ROUTINE W REFLEX MICROSCOPIC - Abnormal; Notable for the  following:    Hgb urine dipstick LARGE (*)    All other components within normal limits  URINE MICROSCOPIC-ADD ON   Imaging Review No results found.  EKG Interpretation   None       MDM   1. Abdominal pain    52yM with known kidney stones. Blood on UA consistent with this. No evidence of UTI. Essentially here for symptom control. Feels better. Has urology FU. Return precautions discussed.     Raeford RazorStephen Kendrick Haapala, MD 08/04/13 [phone]

## 2014-08-25 IMAGING — US US ABDOMEN COMPLETE
1 series · 13 of 25 positions shown · non-contrast
Comparison: None.

CLINICAL DATA: Left flank pain, hepatic lesion

EXAM:
ULTRASOUND ABDOMEN COMPLETE

[Series 1: us abdomen complete · 0.24mm/px · 13 of 88 slices shown]
[im 1/88]
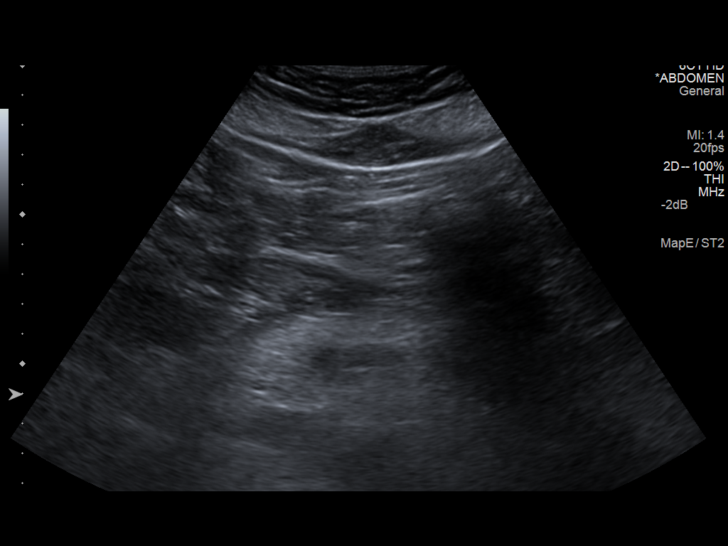
[im 8/88]
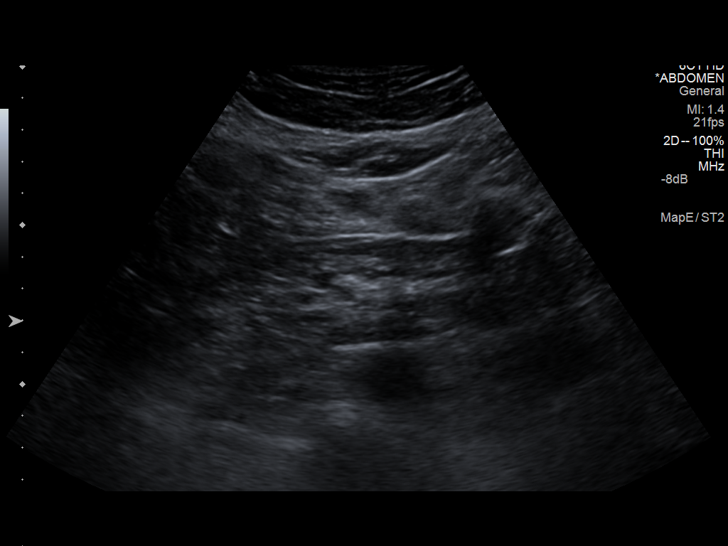
[im 15/88]
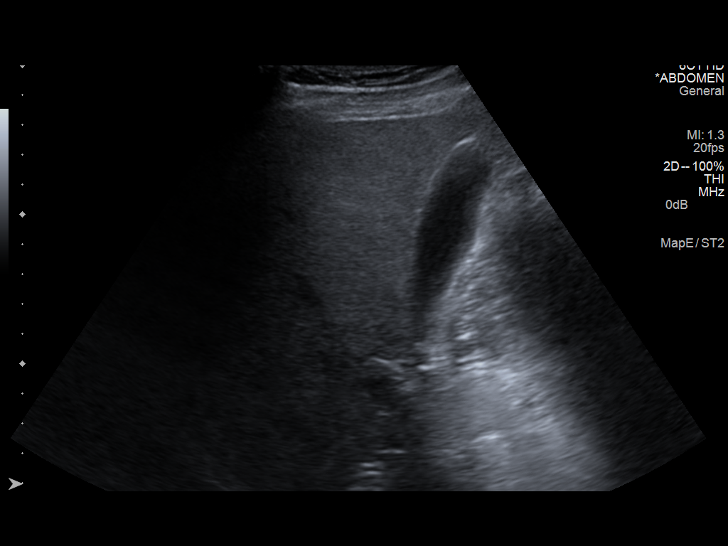
[im 22/88]
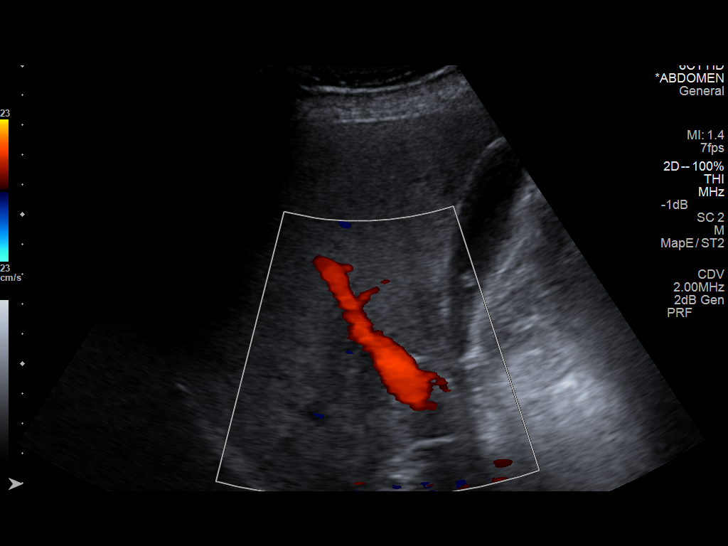
[im 30/88]
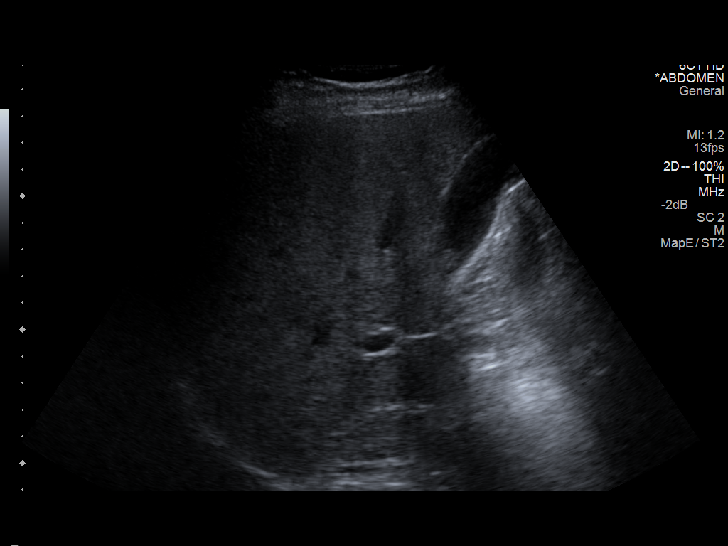
[im 37/88]
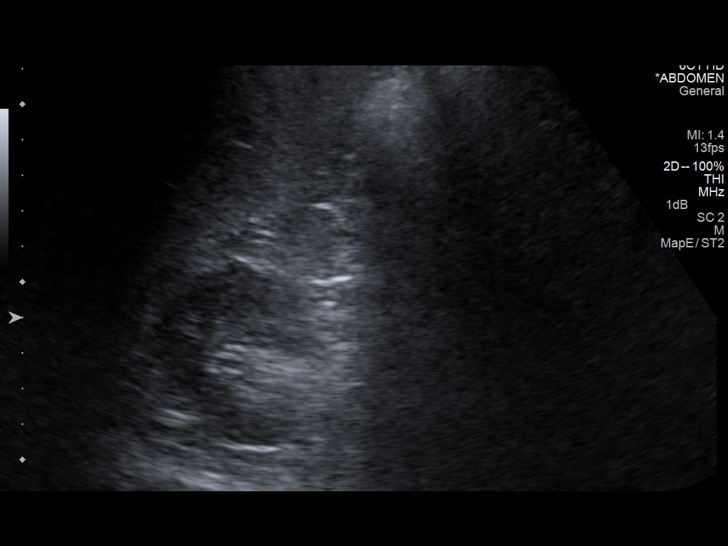
[im 44/88]
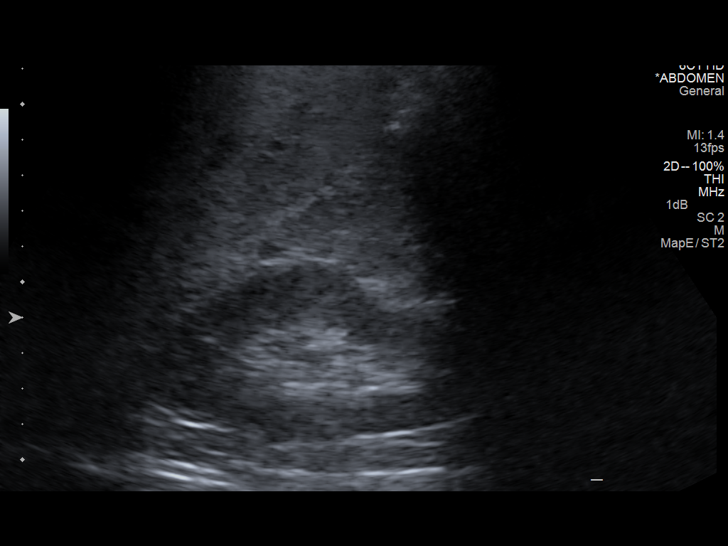
[im 51/88]
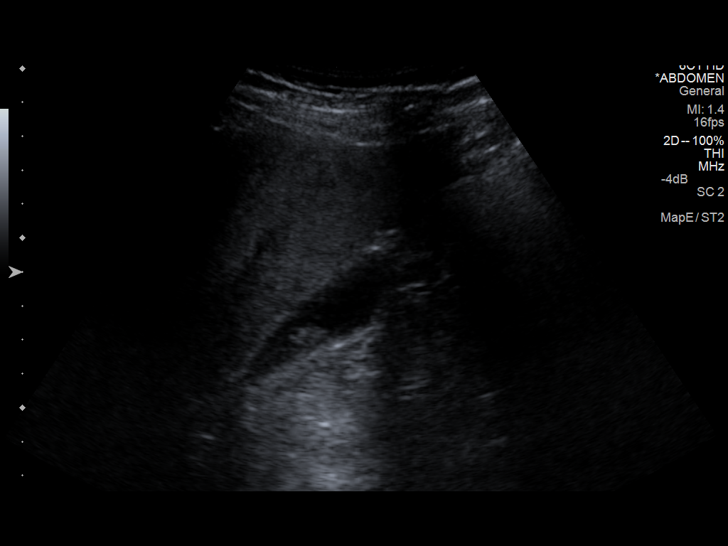
[im 59/88]
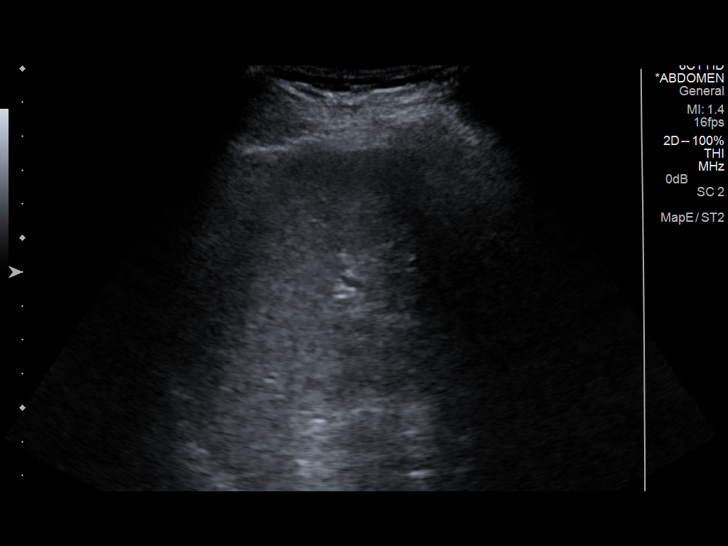
[im 66/88]
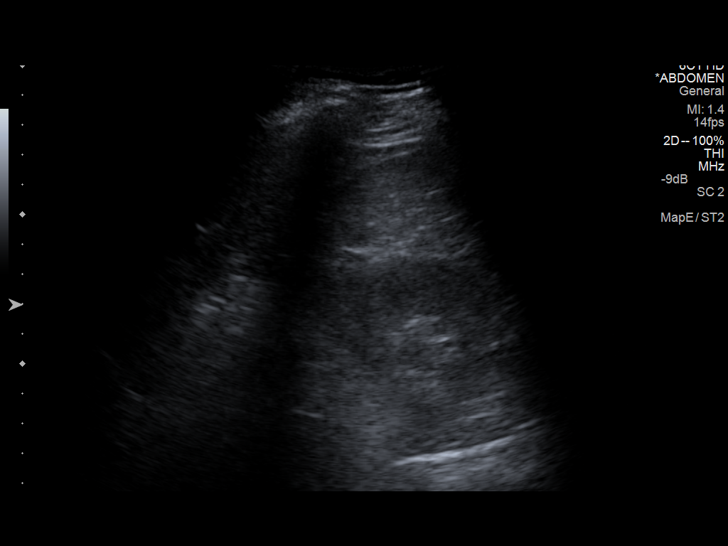
[im 73/88]
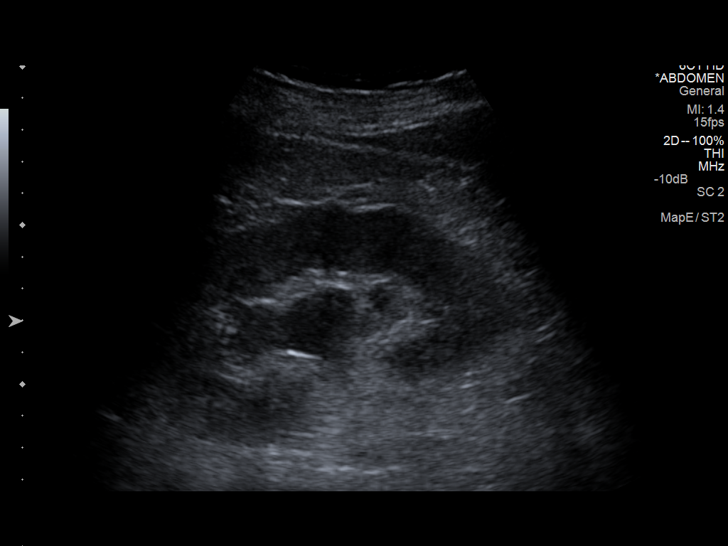
[im 80/88]
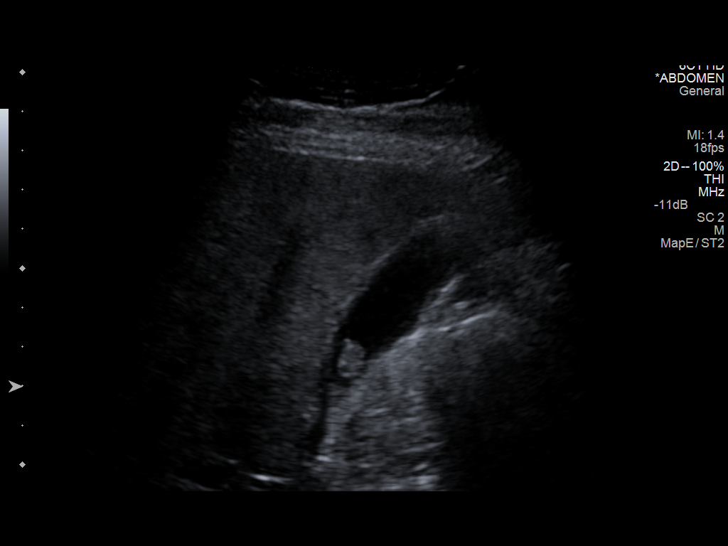
[im 88/88]
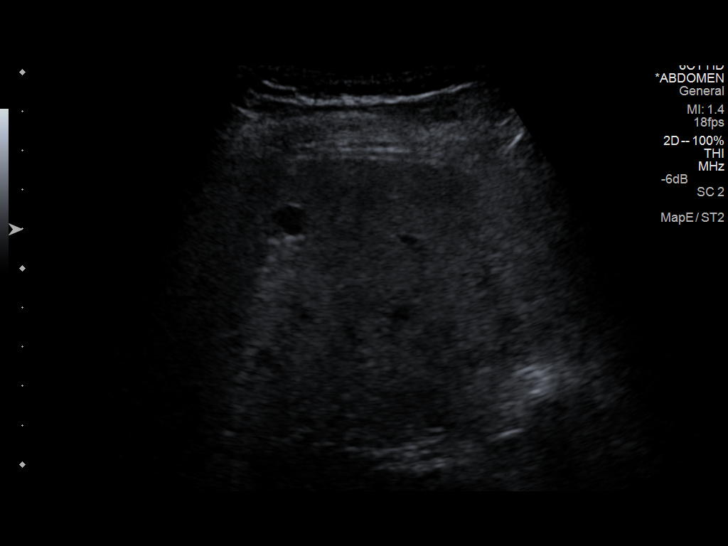

[13 of 25 positions shown; findings below may reference images not displayed]

FINDINGS: Gallbladder:

Echogenic, nonmobile, mural based, non shadowing foci are
appreciated within the gallbladder. These findings have the
appearance of polyps. The largest measures 1.1 cm. There is
otherwise no evidence of gallstones nor sludge seen. There is no
evidence of gallbladder wall thickening at 1.7 mm nor a sonographic
Murphy sign.

Common bile duct:

Diameter: 3.8 mm.

Liver:

Dense echogenic architecture. A small 1 mild 0.9 x 0.8 cm cyst is
identified within the right lobe of the liver.

IVC:

No abnormality visualized.

Pancreas:

Visualized portion unremarkable.

Spleen:

Size and appearance within normal limits.

Right Kidney:

Length: 10.1 cm. Echogenicity within normal limits. No mass or
hydronephrosis visualized.

Left Kidney:

Length: 11.1 cm.. A 3 mm echogenic focus projects within the
cortical medullary portion of the upper pole. There is pelviectasis
within the left kidney. No evidence of overt hydronephrosis.

Abdominal aorta:

No aneurysm visualized.

Other findings:

Bilateral ureteral jets are appreciated.
IMPRESSION: 1. Pelviectasis within the left kidney. The patient reports
decreased left flank pain and these findings may reflect the sequela
of a prior calculus within the system. Nonobstructing calculus upper
pole left kidney. Bilateral ureteral jets are identified.
2. Hepatic steatosis
3. Findings consistent with polyps within the gallbladder.
4. Benign appearing cyst within the liver.

## 2015-08-25 ENCOUNTER — Emergency Department (HOSPITAL_COMMUNITY): Payer: BLUE CROSS/BLUE SHIELD

## 2015-08-25 ENCOUNTER — Emergency Department (HOSPITAL_COMMUNITY)
Admission: EM | Admit: 2015-08-25 | Discharge: 2015-08-25 | Disposition: A | Discharge status: Home / Self Care | Payer: BLUE CROSS/BLUE SHIELD | Attending: Emergency Medicine | Admitting: Emergency Medicine

## 2015-08-25 ENCOUNTER — Encounter (HOSPITAL_COMMUNITY): Payer: Self-pay

## 2015-08-25 DIAGNOSIS — Y998 Other external cause status: Secondary | ICD-10-CM | POA: Diagnosis not present

## 2015-08-25 DIAGNOSIS — Y9389 Activity, other specified: Secondary | ICD-10-CM | POA: Insufficient documentation

## 2015-08-25 DIAGNOSIS — S92342A Displaced fracture of fourth metatarsal bone, left foot, initial encounter for closed fracture: Secondary | ICD-10-CM | POA: Insufficient documentation

## 2015-08-25 DIAGNOSIS — Z87442 Personal history of urinary calculi: Secondary | ICD-10-CM | POA: Insufficient documentation

## 2015-08-25 DIAGNOSIS — S99922A Unspecified injury of left foot, initial encounter: Secondary | ICD-10-CM | POA: Diagnosis present

## 2015-08-25 DIAGNOSIS — X503XXA Overexertion from repetitive movements, initial encounter: Secondary | ICD-10-CM | POA: Diagnosis not present

## 2015-08-25 DIAGNOSIS — F17210 Nicotine dependence, cigarettes, uncomplicated: Secondary | ICD-10-CM | POA: Insufficient documentation

## 2015-08-25 DIAGNOSIS — S92332A Displaced fracture of third metatarsal bone, left foot, initial encounter for closed fracture: Secondary | ICD-10-CM | POA: Insufficient documentation

## 2015-08-25 DIAGNOSIS — S92322A Displaced fracture of second metatarsal bone, left foot, initial encounter for closed fracture: Secondary | ICD-10-CM | POA: Insufficient documentation

## 2015-08-25 DIAGNOSIS — Z79899 Other long term (current) drug therapy: Secondary | ICD-10-CM | POA: Insufficient documentation

## 2015-08-25 DIAGNOSIS — K219 Gastro-esophageal reflux disease without esophagitis: Secondary | ICD-10-CM | POA: Diagnosis not present

## 2015-08-25 DIAGNOSIS — Y9289 Other specified places as the place of occurrence of the external cause: Secondary | ICD-10-CM | POA: Insufficient documentation

## 2015-08-25 DIAGNOSIS — S92902A Unspecified fracture of left foot, initial encounter for closed fracture: Secondary | ICD-10-CM

## 2015-08-25 MED ORDER — IBUPROFEN 800 MG PO TABS
800.0000 mg | ORAL_TABLET | Freq: Once | ORAL | Status: AC
  Administered 2015-08-25: 800 mg via ORAL
  Filled 2015-08-25: qty 1

## 2015-08-25 MED ORDER — HYDROCODONE-ACETAMINOPHEN 5-325 MG PO TABS: 1.0000 | ORAL_TABLET | ORAL | Status: AC | PRN

## 2015-08-25 MED ORDER — IBUPROFEN 800 MG PO TABS: 800.0000 mg | ORAL_TABLET | Freq: Three times a day (TID) | ORAL | Status: AC

## 2015-08-25 MED ORDER — HYDROCODONE-ACETAMINOPHEN 5-325 MG PO TABS
2.0000 | ORAL_TABLET | Freq: Once | ORAL | Status: AC
Start: 2015-08-25 — End: 2015-08-25
  Administered 2015-08-25: 2 via ORAL
  Filled 2015-08-25: qty 2

## 2015-08-25 NOTE — Discharge Instructions (Signed)
Metatarsal Fracture A metatarsal fracture is a break in a metatarsal bone. Metatarsal bones connect your toe bones to your ankle bones. CAUSES This type of fracture may be caused by:  A sudden twisting of your foot.  A fall onto your foot.  Overuse or repetitive exercise. RISK FACTORS This condition is more likely to develop in people who:  Play contact sports.  Have a bone disease.  Have a low calcium level. SYMPTOMS Symptoms of this condition include:  Pain that is worse when walking or standing.  Pain when pressing on the foot or moving the toes.  Swelling.  Bruising on the top or bottom of the foot.  A foot that appears shorter than the other one. DIAGNOSIS This condition is diagnosed with a physical exam. You may also have imaging tests, such as:  X-rays.  A CT scan.  MRI. TREATMENT Treatment for this condition depends on its severity and whether a bone has moved out of place. Treatment may involve:  Rest.  Wearing foot support such as a cast, splint, or boot for several weeks.  Using crutches.  Surgery to move bones back into the right position. Surgery is usually needed if there are many pieces of broken bone or bones that are very out of place (displaced fracture).  Physical therapy. This may be needed to help you regain full movement and strength in your foot. You will need to return to your health care provider to have X-rays taken until your bones heal. Your health care provider will look at the X-rays to make sure that your foot is healing well. HOME CARE INSTRUCTIONS  If You Have a Cast:  Do not stick anything inside the cast to scratch your skin. Doing that increases your risk of infection.  Check the skin around the cast every day. Report any concerns to your health care provider. You may put lotion on dry skin around the edges of the cast. Do not apply lotion to the skin underneath the cast.  Keep the cast clean and dry. If You Have a Splint  or a Supportive Boot:  Wear it as directed by your health care provider. Remove it only as directed by your health care provider.  Loosen it if your toes become numb and tingle, or if they turn cold and blue.  Keep it clean and dry. Bathing  Do not take baths, swim, or use a hot tub until your health care provider approves. Ask your health care provider if you can take showers. You may only be allowed to take sponge baths for bathing.  If your health care provider approves bathing and showering, cover the cast or splint with a watertight plastic bag to protect it from water. Do not let the cast or splint get wet. Managing Pain, Stiffness, and Swelling  If directed, apply ice to the injured area (if you have a splint, not a cast).  Put ice in a plastic bag.  Place a towel between your skin and the bag.  Leave the ice on for 20 minutes, 2-3 times per day.  Move your toes often to avoid stiffness and to lessen swelling.  Raise (elevate) the injured area above the level of your heart while you are sitting or lying down. Driving  Do not drive or operate heavy machinery while taking pain medicine.  Do not drive while wearing foot support on a foot that you use for driving. Activity  Return to your normal activities as directed by your health care   provider. Ask your health care provider what activities are safe for you.  Perform exercises as directed by your health care provider or physical therapist. Safety  Do not use the injured foot to support your body weight until your health care provider says that you can. Use crutches as directed by your health care provider. General Instructions  Do not put pressure on any part of the cast or splint until it is fully hardened. This may take several hours.  Do not use any tobacco products, including cigarettes, chewing tobacco, or e-cigarettes. Tobacco can delay bone healing. If you need help quitting, ask your health care  provider.  Take medicines only as directed by your health care provider.  Keep all follow-up visits as directed by your health care provider. This is important. SEEK MEDICAL CARE IF:  You have a fever.  Your cast, splint, or boot is too loose or too tight.  Your cast, splint, or boot is damaged.  Your pain medicine is not helping.  You have pain, tingling, or numbness in your foot that is not going away. SEEK IMMEDIATE MEDICAL CARE IF:  You have severe pain.  You have tingling or numbness in your foot that is getting worse.  Your foot feels cold or becomes numb.  Your foot changes color.   This information is not intended to replace advice given to you by your health care provider. Make sure you discuss any questions you have with your health care provider.   Document Released: 04/04/2002 Document Revised: 11/27/2014 Document Reviewed: 05/09/2014 Elsevier Interactive Patient Education 2016 [employer]  

## 2015-08-25 NOTE — ED Notes (Signed)
Pt missed 3 steps this morning and injured left foot.  Foot is swollen and throbbing per patient.

## 2015-08-25 NOTE — ED Notes (Signed)
Crutch and cam walker instructions given

## 2015-08-25 NOTE — ED Provider Notes (Signed)
CSN: [id number]     Arrival date & time 08/25/15  0720 History   First MD Initiated Contact with Patient 08/25/15 0730     Chief Complaint  Patient presents with  . Foot Injury     (Consider location/radiation/quality/duration/timing/severity/associated sxs/prior Treatment) HPI Patient was coming down the stairs this morning. He miss stepped and caused his left forefoot to bend forward. He reports he did fall but he does not have any other injury. He states however his foot is very swollen and painful. He cannot put weight on it. Past Medical History  Diagnosis Date  . GERD (gastroesophageal reflux disease)   . History of kidney stones    Past Surgical History  Procedure Laterality Date  . Appendectomy    . Hand surgery    . Cystoscopy with retrograde pyelogram, ureteroscopy and stent placement Left 07/31/2013    Procedure: CYSTOSCOPY WITH RETROGRADE PYELOGRAM, URETEROSCOPY AND STENT PLACEMENT;  Surgeon: Crecencio Mc, MD;  Location: [facility];  Service: Urology;  Laterality: Left;  . Holmium laser application Left 07/31/2013    Procedure: HOLMIUM LASER APPLICATION;  Surgeon: Crecencio Mc, MD;  Location: [facility];  Service: Urology;  Laterality: Left;   History reviewed. No pertinent family history. Social History  Substance Use Topics  . Smoking status: Current Every Day Smoker -- 0.50 packs/day for 25 years    Types: Cigarettes  . Smokeless tobacco: Never Used  . Alcohol Use: 4.8 oz/week    8 Cans of beer per week    Review of Systems  10 Systems reviewed and are negative for acute change except as noted in the HPI.   Allergies  Review of patient's allergies indicates no known allergies.  Home Medications   Prior to Admission medications   Medication Sig Start Date End Date Taking? Authorizing Provider  Esomeprazole Magnesium (NEXIUM 24HR PO) Take 1 capsule by mouth daily.   Yes Historical Provider, MD  ciprofloxacin (CIPRO) 500 MG tablet Begin one day prior to stent  removal. Patient not taking: Reported on 08/25/2015 07/31/13   Heloise Purpura, MD  HYDROcodone-acetaminophen (NORCO/VICODIN) 5-325 MG per tablet Take 2 tablets by mouth every 6 (six) hours as needed. Patient not taking: Reported on 08/25/2015 07/10/13   Darlys Gales, MD  HYDROcodone-acetaminophen (NORCO/VICODIN) 5-325 MG per tablet Take 1-2 tablets by mouth every 6 (six) hours as needed. Patient not taking: Reported on 08/25/2015 07/31/13   Heloise Purpura, MD  HYDROcodone-acetaminophen (NORCO/VICODIN) 5-325 MG tablet Take 1-2 tablets by mouth every 4 (four) hours as needed for moderate pain or severe pain. 08/25/15   Arby Barrette, MD  ibuprofen (ADVIL,MOTRIN) 800 MG tablet Take 1 tablet (800 mg total) by mouth 3 (three) times daily. 08/25/15   Arby Barrette, MD   BP 136/90 mmHg  Pulse 82  Temp(Src) 97.9 F (36.6 C) (Oral)  Resp 18  SpO2 99% Physical Exam  Constitutional: He is oriented to person, place, and time. He appears well-developed and well-nourished. No distress.  HENT:  Head: Normocephalic and atraumatic.  Eyes: EOM are normal.  Pulmonary/Chest: Effort normal.  Musculoskeletal:  Left forefoot is moderately swollen and very tender to palpation. Malleolus is nontender to compression. Dorsalis pedis pulses 2+. Patient has intact neurologic function of the toes. Normal range of motion of the knee and the hip.  Neurological: He is alert and oriented to person, place, and time. He exhibits normal muscle tone. Coordination normal.  Skin: Skin is warm and dry.  Psychiatric: He has a normal mood and affect.  ED Course  Procedures (including critical care time) Labs Review Labs Reviewed - No data to display  Imaging Review Ct Foot Left Wo Contrast  08/25/2015  CLINICAL DATA:  Fall, left foot injury.  Abnormal x-ray. EXAM: CT OF THE LEFT FOOT WITHOUT CONTRAST TECHNIQUE: Multidetector CT imaging of the left foot was performed according to the standard protocol. Multiplanar CT image  reconstructions were also generated. COMPARISON:  Plain films 08/25/2015 FINDINGS: Comminuted fracture noted through the base of the left second metatarsal. Slightly displaced fractures through the base of the third and fourth metatarsals. No fracture through the first or fifth metatarsal. No subluxation or dislocation. Soft tissues are intact. Dorsal soft tissue swelling. IMPRESSION: Fractures through the base of the second, third and fourth metatarsals. Electronically Signed   By: Charlett Nose M.D.   On: 08/25/2015 09:52   Dg Foot Complete Left  08/25/2015  CLINICAL DATA:  Larey Seat down 3 steps this morning.  Foot pain. EXAM: LEFT FOOT - COMPLETE 3+ VIEW COMPARISON:  None. FINDINGS: Abnormal appearance at the base of the first through fourth metatarsals concerning for fractures. No significant displacement. Joint spaces are maintained. Mild soft tissue swelling along the dorsum of the foot. IMPRESSION: Abnormal appearance at the base of the first through fourth metatarsals concerning for fractures. This could be better visualized and characterized with CT of the foot. Electronically Signed   By: Charlett Nose M.D.   On: 08/25/2015 08:44   I have personally reviewed and evaluated these images and lab results as part of my medical decision-making.   EKG Interpretation None      MDM   Final diagnoses:  Foot fracture, left   Metatarsal fractures 2,3 and 4. CT rules out subluxation or dislocation. At this time patient has isolated foot injury from a fall. He will be placed in a walking boot with crutches. Instructions will be provided for elevation and icing. Follow-up instructions are given. Ibuprofen and Vicodin for pain control.     Arby Barrette, MD 08/25/15 1044

## 2020-01-15 ENCOUNTER — Other Ambulatory Visit: Payer: Self-pay | Admitting: Physician Assistant

## 2020-01-15 ENCOUNTER — Ambulatory Visit
Admission: RE | Admit: 2020-01-15 | Discharge: 2020-01-15 | Disposition: A | Discharge status: Home / Self Care | Payer: No Typology Code available for payment source | Source: Ambulatory Visit | Attending: Physician Assistant | Admitting: Physician Assistant

## 2020-01-15 DIAGNOSIS — S92002A Unspecified fracture of left calcaneus, initial encounter for closed fracture: Secondary | ICD-10-CM

## 2021-02-11 IMAGING — CT CT ANKLE*L* W/O CM
3 series · 11 of 33 positions shown, 13 images · non-contrast
Comparison: CT scan of the foot dated 08/25/2015

CLINICAL DATA: Calcaneus fracture after falling from a ladder 15
feet today.

EXAM:
CT OF THE LEFT ANKLE WITHOUT CONTRAST
TECHNIQUE: Multidetector CT imaging of the left ankle was performed according
to the standard protocol. Multiplanar CT image reconstructions were
also generated.

[Series 5: sfov lower extremity 2.00 br40 s3 soft · axial · 0.22mm/px · z∈[+631,+751]mm · 3 of 99 slices shown, 4 images (1 of 3)]
[im 23/99  soft-tissue]
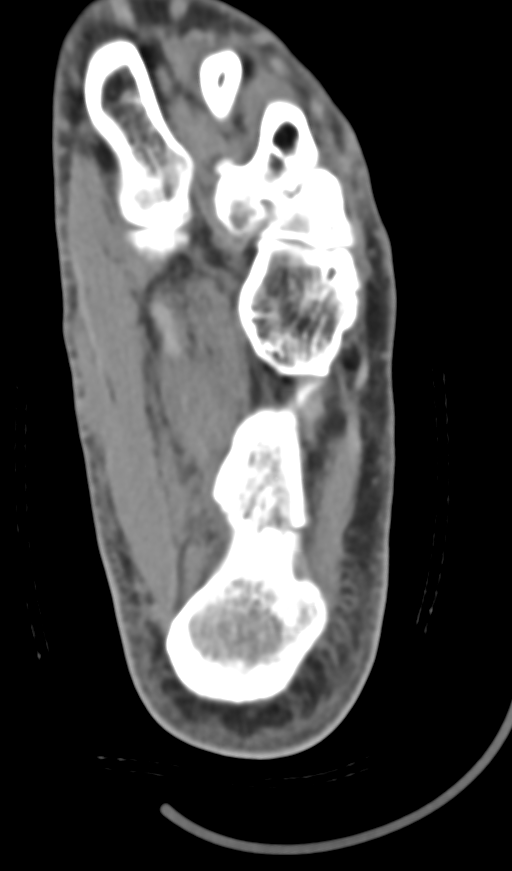
[im 23/99  bone]
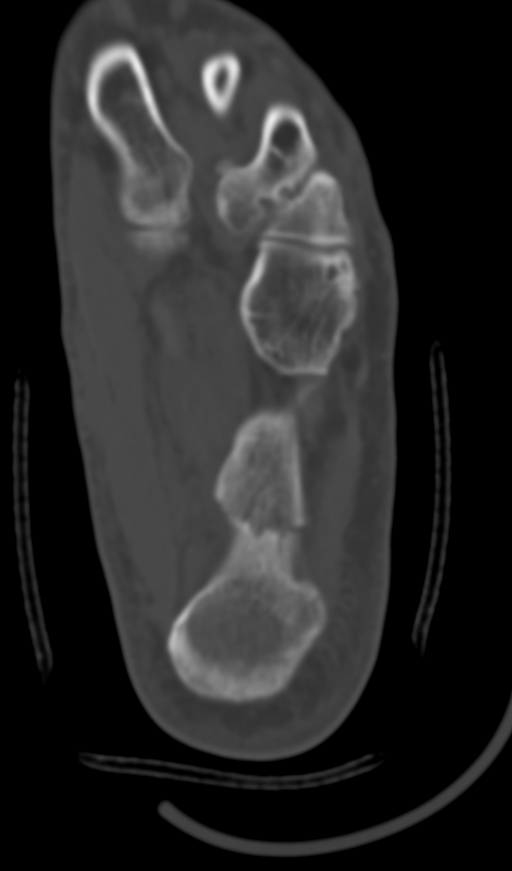
[im 53/99  bone]
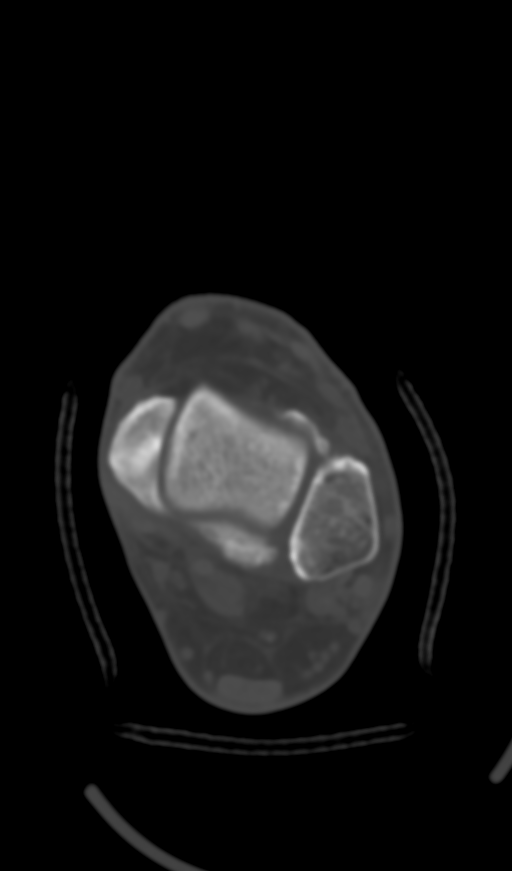
[im 83/99  bone]
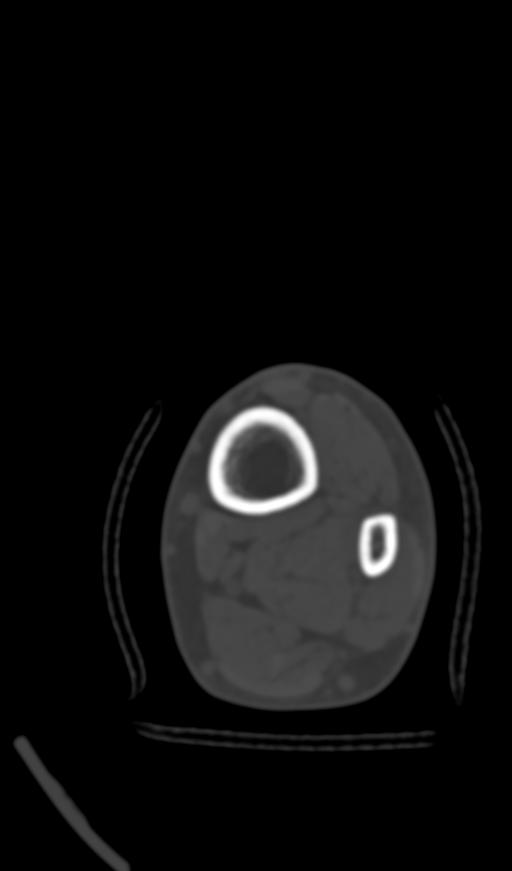

[Series 9: sfov lower extremity 2.00 br40 s3 soft · coronal · 0.22mm/px · 3 of 94 slices shown (2 of 3)]
[im 19/94  bone]
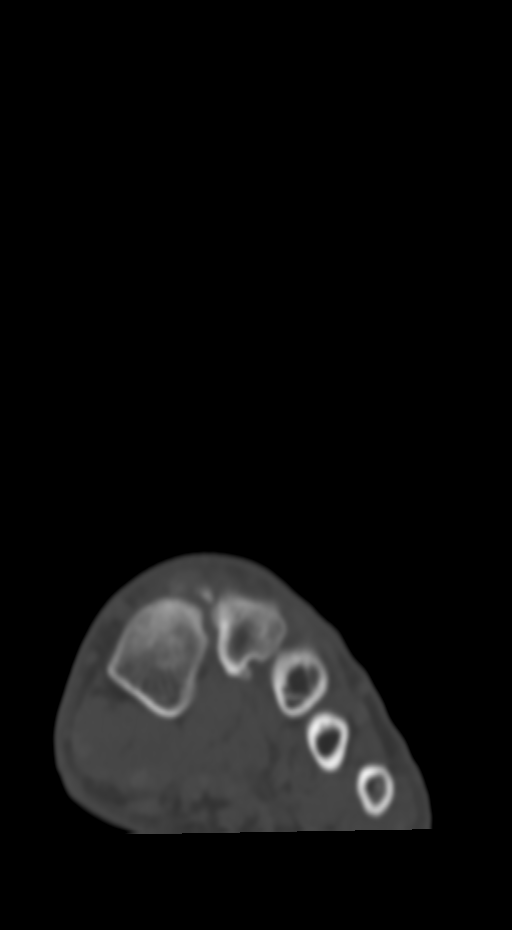
[im 38/94  bone]
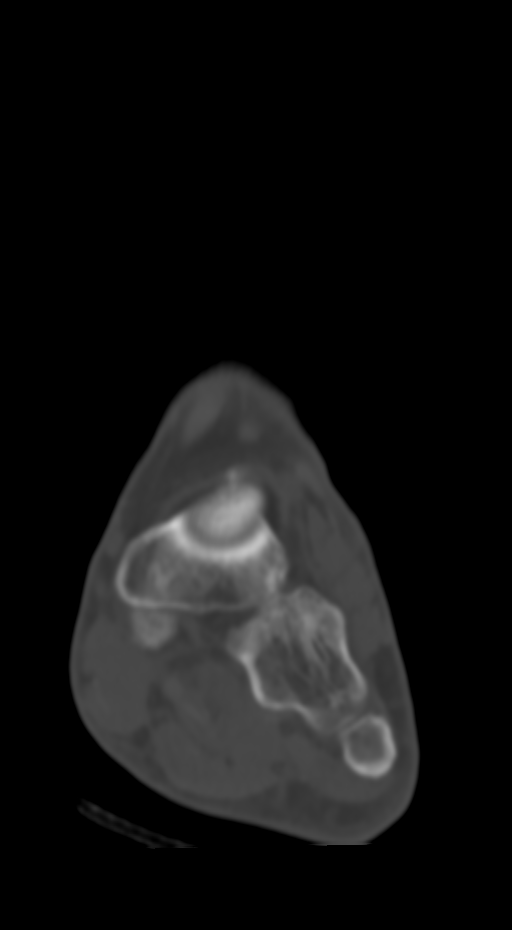
[im 56/94  bone]
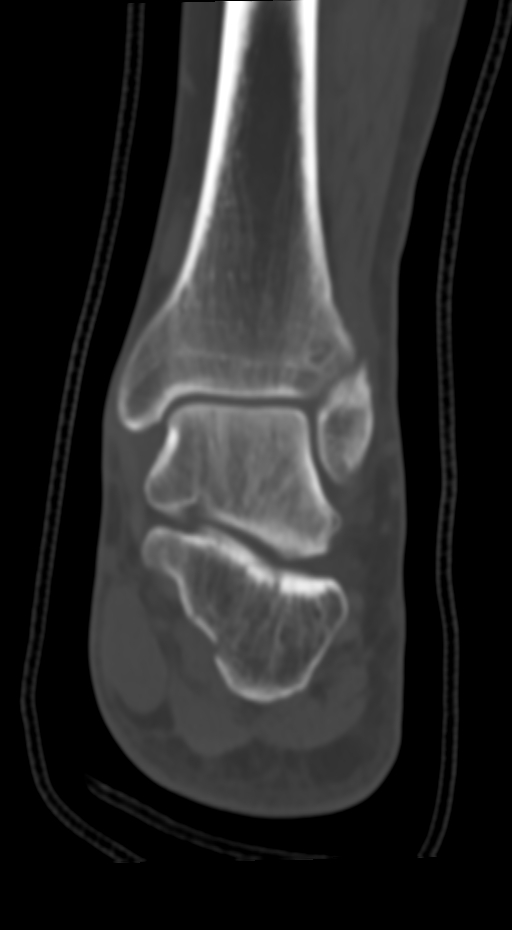

[Series 13: sfov lower extremity 2.00 br40 s3 soft · sagittal · 0.39mm/px · 5 of 57 slices shown, 6 images (3 of 3)]
[im 19/57  bone]
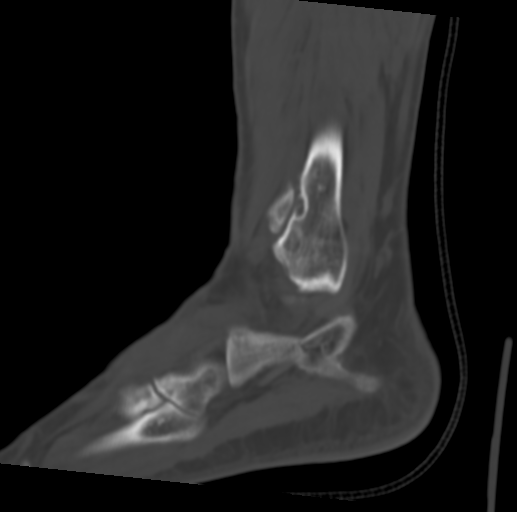
[im 24/57  bone]
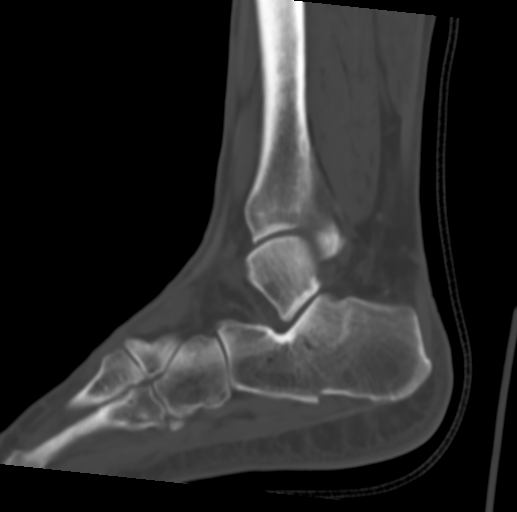
[im 29/57  soft-tissue]
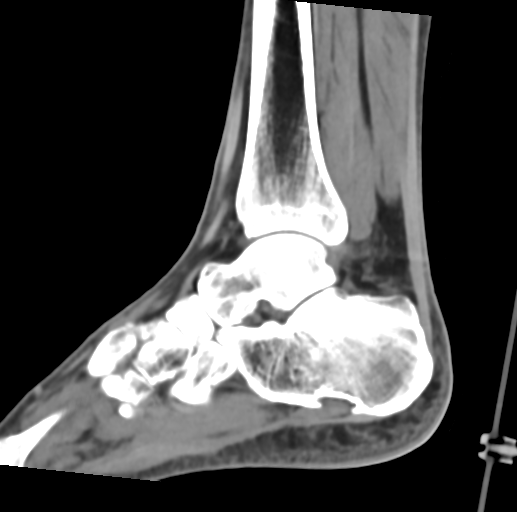
[im 29/57  bone]
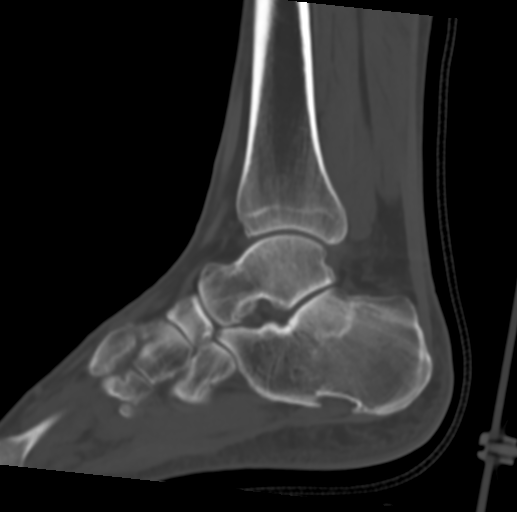
[im 33/57  bone]
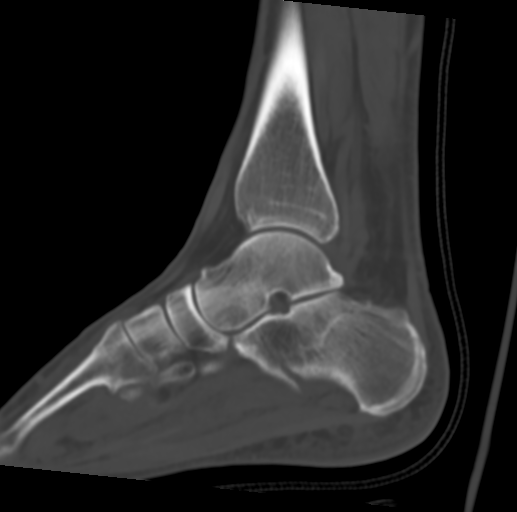
[im 38/57  bone]
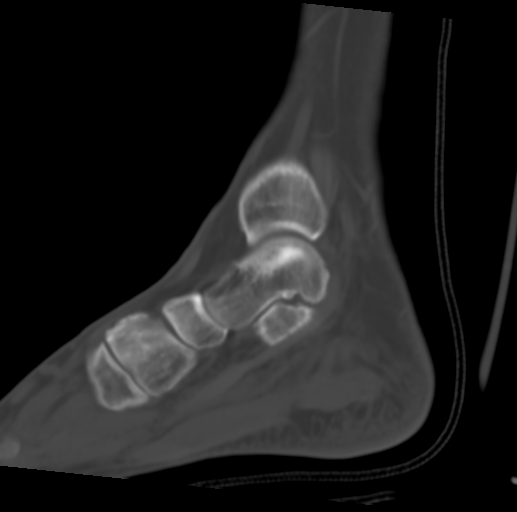

[11 of 33 positions shown; findings below may reference images not displayed]

FINDINGS: Bones/Joint/Cartilage

There is a comminuted minimally impacted and minimally displaced
fracture of the calcaneus. There are oblique and coronal and
transverse fractures. Oblique fracture through the base of the
sustentaculum tali. Fracture also involves the anterior and middle
facets with no depression of the articular surfaces. The other
visualized bones of the ankle and foot are intact. Previous
fractures of the bases of second, third and fourth metatarsals have
completely healed with slight residual deformity and slight
posttraumatic arthritis.

Ligaments

Suboptimally assessed by CT. The ligaments of the medial and lateral
aspects of the ankle are normal.

Muscles and Tendons

No tendon entrapment.  No muscle abnormality.

Soft tissues

Negative.
IMPRESSION: 1. Comminuted minimally impacted and minimally displaced fracture of
the calcaneus as described.
2. Previous fractures of the bases of second, third and fourth
metatarsals have completely healed with slight residual deformity
and slight posttraumatic arthritis.
